# Patient Record
Sex: Male | Born: 1995 | Race: White | Hispanic: No | Marital: Married | Smoking: Never smoker
Health system: Southern US, Community
[De-identification: ages and names within clinical notes are randomized; demographics above are authoritative.]

---

## 2020-01-19 ENCOUNTER — Ambulatory Visit (HOSPITAL_COMMUNITY)
Admission: EM | Admit: 2020-01-19 | Discharge: 2020-01-19 | Disposition: A | Discharge status: Home / Self Care | Payer: Worker's Compensation | Attending: Family Medicine | Admitting: Family Medicine

## 2020-01-19 ENCOUNTER — Ambulatory Visit (INDEPENDENT_AMBULATORY_CARE_PROVIDER_SITE_OTHER): Payer: Worker's Compensation

## 2020-01-19 ENCOUNTER — Encounter (HOSPITAL_COMMUNITY): Payer: Self-pay

## 2020-01-19 ENCOUNTER — Other Ambulatory Visit: Payer: Self-pay

## 2020-01-19 DIAGNOSIS — S43002A Unspecified subluxation of left shoulder joint, initial encounter: Secondary | ICD-10-CM

## 2020-01-19 DIAGNOSIS — M25512 Pain in left shoulder: Secondary | ICD-10-CM | POA: Diagnosis not present

## 2020-01-19 NOTE — ED Triage Notes (Signed)
Pt is here with a left shoulder injury that happened after picking up a chest of drawers & heard his shoulder pop today at work, pt has not taken any meds to relieve discomfort.

## 2020-01-19 NOTE — Discharge Instructions (Addendum)
Please try ice  Please use the sling for one week.  Please follow up in one week.

## 2020-01-19 NOTE — ED Provider Notes (Signed)
[facility]    CSN: [id number] Arrival date & time: 01/19/20  1550      History   Chief Complaint Chief Complaint  Patient presents with  . Shoulder Injury    HPI Derek Arroyo is a 24 y.o. male.   He is presenting with an injury that he sustained at work.  He is having left shoulder pain after he felt a pop while lifting furniture.  Denies any history of similar pain.  No history of surgery.  No bruising or swelling.  Has trouble with external rotation and crossarm testing  HPI  History reviewed. No pertinent past medical history.  There are no problems to display for this patient.   History reviewed. No pertinent surgical history.     Home Medications    Prior to Admission medications   Not on File    Family History Family History  Problem Relation Age of Onset  . Breast cancer Mother   . Stomach cancer Father     Social History Social History   Tobacco Use  . Smoking status: Never Smoker  . Smokeless tobacco: Never Used  Vaping Use  . Vaping Use: Every day  Substance Use Topics  . Alcohol use: Yes  . Drug use: Not on file     Allergies   Patient has no known allergies.   Review of Systems Review of Systems  See HPI  Physical Exam Triage Vital Signs ED Triage Vitals  Enc Vitals Group     BP 01/19/20 1748 136/67     Pulse Rate 01/19/20 1748 87     Resp 01/19/20 1748 19     Temp 01/19/20 1748 98.7 F (37.1 C)     Temp Source 01/19/20 1748 Oral     SpO2 01/19/20 1748 99 %     Weight --      Height --      Head Circumference --      Peak Flow --      Pain Score 01/19/20 1745 7     Pain Loc --      Pain Edu? --      Excl. in GC? --    No data found.  Updated Vital Signs BP 136/67 (BP Location: Right Arm)   Pulse 87   Temp 98.7 F (37.1 C) (Oral)   Resp 19   SpO2 99%   Visual Acuity Right Eye Distance:   Left Eye Distance:   Bilateral Distance:    Right Eye Near:   Left Eye Near:    Bilateral Near:      Physical Exam Gen: NAD, alert, cooperative with exam, well-appearing ENT: normal lips, normal nasal mucosa,  Eye: normal EOM, normal conjunctiva and lids Neuro: normal tone, normal sensation to touch Psych:  normal insight, alert and oriented MSK:  Left shoulder: Normal external rotation. Normal speeds test. Pain with crossarm testing. Neurovascularly intact   UC Treatments / Results  Labs (all labs ordered are listed, but only abnormal results are displayed) Labs Reviewed - No data to display  EKG   Radiology DG Shoulder Left  Result Date: 01/19/2020 CLINICAL DATA:  Left shoulder pain EXAM: LEFT SHOULDER - 2+ VIEW COMPARISON:  None. FINDINGS: There is no evidence of fracture or dislocation. There is no evidence of arthropathy or other focal bone abnormality. Soft tissues are unremarkable. IMPRESSION: Negative. Electronically Signed   By: Jasmine Pang M.D.   On: 01/19/2020 19:13    Procedures Procedures (including critical care time)  Medications Ordered in UC Medications - No data to display  Initial Impression / Assessment and Plan / UC Course  I have reviewed the triage vital signs and the nursing notes.  Pertinent labs & imaging results that were available during my care of the patient were reviewed by me and considered in my medical decision making (see chart for details).     Mr. Derek Arroyo is a 24 year old male that is presenting with a left shoulder pain after an injury sustained at work.  Symptoms suggestive shoulder subluxation.  Imaging was negative for fracture.  Provided sling and counseled on supportive care.  Given indications on follow-up.  Provided work note.  Final Clinical Impressions(s) / UC Diagnoses   Final diagnoses:  Shoulder subluxation, left, initial encounter     Discharge Instructions     Please try ice  Please use the sling for one week.  Please follow up in one week.      ED Prescriptions    None     PDMP not reviewed this  encounter.   Myra Rude, MD 01/19/20 [phone]

## 2020-01-27 ENCOUNTER — Ambulatory Visit (INDEPENDENT_AMBULATORY_CARE_PROVIDER_SITE_OTHER): Payer: Worker's Compensation | Admitting: Family Medicine

## 2020-01-27 ENCOUNTER — Ambulatory Visit: Payer: Self-pay

## 2020-01-27 ENCOUNTER — Other Ambulatory Visit: Payer: Self-pay

## 2020-01-27 ENCOUNTER — Encounter: Payer: Self-pay | Admitting: Family Medicine

## 2020-01-27 VITALS — BP 120/77 | HR 80 | Ht 70.0 in | Wt 235.0 lb

## 2020-01-27 DIAGNOSIS — S43002D Unspecified subluxation of left shoulder joint, subsequent encounter: Secondary | ICD-10-CM | POA: Insufficient documentation

## 2020-01-27 DIAGNOSIS — S43002A Unspecified subluxation of left shoulder joint, initial encounter: Secondary | ICD-10-CM

## 2020-01-27 DIAGNOSIS — M25512 Pain in left shoulder: Secondary | ICD-10-CM

## 2020-01-27 DIAGNOSIS — S43432D Superior glenoid labrum lesion of left shoulder, subsequent encounter: Secondary | ICD-10-CM | POA: Insufficient documentation

## 2020-01-27 MED ORDER — PREDNISONE 5 MG PO TABS: ORAL_TABLET | ORAL | 0 refills | Status: AC

## 2020-01-27 NOTE — Assessment & Plan Note (Signed)
Pain is the result of an injury sustained at work.  Occurred on 8/31.  Still likely a subluxation.  Ultrasound suggestive of capsular thickening and axillary view. -Counseled on supportive care. -Prednisone. -Provided work note. -Could consider physical therapy or further imaging.

## 2020-01-27 NOTE — Progress Notes (Signed)
Derek Arroyo - 24 y.o. male MRN [id number]  Date of birth: 05-10-1996  SUBJECTIVE:  Including CC & ROS.  Chief Complaint  Patient presents with  . Shoulder Injury    left x 01/19/2020    Derek Arroyo is a 24 y.o. male that is presenting with left shoulder pain.  His pain is a result of an injury sustained at work.  He was lifting an object and pulling towards himself and felt a pop.  He was placed in a sling on 8/31 but his pain seems to be ongoing.  He has pain with riding in a car or walking around.  He reports intermittent abnormal sensations in his hand.  No improvement with medications thus far.  Independent review of the left shoulder x-ray from 8/31 shows no acute abnormality.   Review of Systems See HPI   HISTORY: Past Medical, Surgical, Social, and Family History Reviewed & Updated per EMR.   Pertinent Historical Findings include:  No past medical history on file.  No past surgical history on file.  Family History  Problem Relation Age of Onset  . Breast cancer Mother   . Stomach cancer Father     Social History   Socioeconomic History  . Marital status: Married    Spouse name: Not on file  . Number of children: Not on file  . Years of education: Not on file  . Highest education level: Not on file  Occupational History  . Not on file  Tobacco Use  . Smoking status: Never Smoker  . Smokeless tobacco: Never Used  Vaping Use  . Vaping Use: Every day  Substance and Sexual Activity  . Alcohol use: Yes  . Drug use: Not on file  . Sexual activity: Yes    Birth control/protection: None    Comment: Married  Other Topics Concern  . Not on file  Social History Narrative  . Not on file   Social Determinants of Health   Financial Resource Strain:   . Difficulty of Paying Living Expenses: Not on file  Food Insecurity:   . Worried About Programme researcher, broadcasting/film/video in the Last Year: Not on file  . Ran Out of Food in the Last Year: Not on file  Transportation  Needs:   . Lack of Transportation (Medical): Not on file  . Lack of Transportation (Non-Medical): Not on file  Physical Activity:   . Days of Exercise per Week: Not on file  . Minutes of Exercise per Session: Not on file  Stress:   . Feeling of Stress : Not on file  Social Connections:   . Frequency of Communication with Friends and Family: Not on file  . Frequency of Social Gatherings with Friends and Family: Not on file  . Attends Religious Services: Not on file  . Active Member of Clubs or Organizations: Not on file  . Attends Banker Meetings: Not on file  . Marital Status: Not on file  Intimate Partner Violence:   . Fear of Current or Ex-Partner: Not on file  . Emotionally Abused: Not on file  . Physically Abused: Not on file  . Sexually Abused: Not on file     PHYSICAL EXAM:  VS: BP 120/77   Pulse 80   Ht 5\' 10"  (1.778 m)   Wt 235 lb (106.6 kg)   BMI 33.72 kg/m  Physical Exam Gen: NAD, alert, cooperative with exam, well-appearing MSK:  Left shoulder: Limited flexion and abduction. Normal passive flexion and abduction.  No ecchymosis or swelling. Normal grip strength. Normal elbow range of motion. Neurovascularly intact  Limited ultrasound: Left shoulder:  Normal-appearing biceps tendon and short and long axis. Normal subscapularis. Normal-appearing supraspinatus and dynamic and static testing. Normal-appearing posterior glenohumeral joint. Axillary view of the capsule appears to be thickened  Summary: Findings would suggest a possible capsular irritation.  Ultrasound and interpretation by Clare Gandy, MD    ASSESSMENT & PLAN:   Shoulder subluxation, left, initial encounter Pain is the result of an injury sustained at work.  Occurred on 8/31.  Still likely a subluxation.  Ultrasound suggestive of capsular thickening and axillary view. -Counseled on supportive care. -Prednisone. -Provided work note. -Could consider physical therapy or  further imaging.

## 2020-01-27 NOTE — Patient Instructions (Signed)
Good to see you Please use ice as needed  Please try the range of motion movements  Please let me know how you feel after the prednisone  Please send me a message in MyChart with any questions or updates.  Please see me back in 4 weeks.   --Dr. Jordan Likes

## 2020-02-04 ENCOUNTER — Telehealth: Payer: Self-pay | Admitting: Family Medicine

## 2020-02-04 NOTE — Telephone Encounter (Signed)
Pt's WC adjuster/ Ainsley Spinner @ [facility] [phone] called to request LOV notes --forwarding to fax# [phone] for review .  -- WC clm# in claim info tab/folder.  --glh

## 2020-02-18 ENCOUNTER — Telehealth: Payer: Self-pay | Admitting: Family Medicine

## 2020-02-18 NOTE — Telephone Encounter (Signed)
Answer patient's questions.   Myra Rude, MD [facility] 02/18/2020, 12:42 PM

## 2020-02-25 ENCOUNTER — Telehealth: Payer: Self-pay | Admitting: Family Medicine

## 2020-02-25 NOTE — Telephone Encounter (Signed)
Patient called upset states he received call from [facility] adjuster stating he can no longer be seen by Dr.Schmitz but has to go to different [facility] provider , he has been trying to contact Adjuster without success & wishes Dr. Jordan Likes to call him back to discuss.   --Forwarding message to Provider.  glh

## 2020-02-26 ENCOUNTER — Other Ambulatory Visit: Payer: Self-pay

## 2020-02-26 ENCOUNTER — Ambulatory Visit (INDEPENDENT_AMBULATORY_CARE_PROVIDER_SITE_OTHER): Payer: Worker's Compensation | Admitting: Family Medicine

## 2020-02-26 ENCOUNTER — Encounter: Payer: Self-pay | Admitting: Family Medicine

## 2020-02-26 DIAGNOSIS — S43002D Unspecified subluxation of left shoulder joint, subsequent encounter: Secondary | ICD-10-CM | POA: Diagnosis not present

## 2020-02-26 NOTE — Assessment & Plan Note (Signed)
Symptoms are ongoing since his injury at work.  Has had limited improvement with using the sling.  He does have an MRI ordered -Counseled on home exercise therapy and supportive care. -Discussed physical therapy. -Follow-up after MRI

## 2020-02-26 NOTE — Patient Instructions (Signed)
Good to see you Please try the exercises and range of motion movements  Please try heat   Please send me a message in MyChart with any questions or updates.  Please send me the MRI and we can schedule a virtual visit.   --Dr. Jordan Likes

## 2020-02-26 NOTE — Telephone Encounter (Signed)
Seen in clinic today.   Myra Rude, MD [facility] 02/26/2020, 8:58 AM

## 2020-02-26 NOTE — Progress Notes (Signed)
Shown Dissinger - 24 y.o. male MRN [id number]  Date of birth: 10/19/1995  SUBJECTIVE:  Including CC & ROS.  Chief Complaint  Patient presents with   Follow-up    left shoulder    Derek Arroyo is a 24 y.o. male that is following up for his left shoulder pain.  His symptoms are still ongoing since his injury at work.  He has tightness around the neck as well as altered sensation in the forearm.  He has trouble with driving as well as lifting any objects.  Symptoms are still localized around the shoulder.   Review of Systems See HPI   HISTORY: Past Medical, Surgical, Social, and Family History Reviewed & Updated per EMR.   Pertinent Historical Findings include:  No past medical history on file.  No past surgical history on file.  Family History  Problem Relation Age of Onset   Breast cancer Mother    Stomach cancer Father     Social History   Socioeconomic History   Marital status: Married    Spouse name: Not on file   Number of children: Not on file   Years of education: Not on file   Highest education level: Not on file  Occupational History   Not on file  Tobacco Use   Smoking status: Never Smoker   Smokeless tobacco: Never Used  Vaping Use   Vaping Use: Every day  Substance and Sexual Activity   Alcohol use: Yes   Drug use: Not on file   Sexual activity: Yes    Birth control/protection: None    Comment: Married  Other Topics Concern   Not on file  Social History Narrative   Not on file   Social Determinants of Health   Financial Resource Strain:    Difficulty of Paying Living Expenses: Not on file  Food Insecurity:    Worried About Programme researcher, broadcasting/film/video in the Last Year: Not on file   [employer] of Food in the Last Year: Not on file  Transportation Needs:    Lack of Transportation (Medical): Not on file   Lack of Transportation (Non-Medical): Not on file  Physical Activity:    Days of Exercise per Week: Not on file   Minutes  of Exercise per Session: Not on file  Stress:    Feeling of Stress : Not on file  Social Connections:    Frequency of Communication with Friends and Family: Not on file   Frequency of Social Gatherings with Friends and Family: Not on file   Attends Religious Services: Not on file   Active Member of Clubs or Organizations: Not on file   Attends Banker Meetings: Not on file   Marital Status: Not on file  Intimate Partner Violence:    Fear of Current or Ex-Partner: Not on file   Emotionally Abused: Not on file   Physically Abused: Not on file   Sexually Abused: Not on file     PHYSICAL EXAM:  VS: BP (!) 146/79    Pulse 90    Ht 5\' 10"  (1.778 m)    Wt 235 lb (106.6 kg)    BMI 33.72 kg/m  Physical Exam Gen: NAD, alert, cooperative with exam, well-appearing    ASSESSMENT & PLAN:   Shoulder subluxation, left, subsequent encounter Symptoms are ongoing since his injury at work.  Has had limited improvement with using the sling.  He does have an MRI ordered -Counseled on home exercise therapy and supportive care. -Discussed  physical therapy. -Follow-up after MRI

## 2020-03-29 ENCOUNTER — Ambulatory Visit (INDEPENDENT_AMBULATORY_CARE_PROVIDER_SITE_OTHER): Payer: Self-pay | Admitting: Family Medicine

## 2020-03-29 ENCOUNTER — Other Ambulatory Visit: Payer: Self-pay

## 2020-03-29 ENCOUNTER — Encounter: Payer: Self-pay | Admitting: Family Medicine

## 2020-03-29 DIAGNOSIS — S43432D Superior glenoid labrum lesion of left shoulder, subsequent encounter: Secondary | ICD-10-CM

## 2020-03-29 NOTE — Assessment & Plan Note (Signed)
Still experiencing pain of his left shoulder from an injury at work.  The MRI was demonstrating a possible posterior labral tear.  He is still unable to carry even a cup without pain.  Having altered sensation of the arm as well. -Counseled on home exercise therapy and supportive care. - discuss with a surgeon about his findings.

## 2020-03-29 NOTE — Patient Instructions (Signed)
Good to see you Please continue the exercises   Please send me a message in MyChart with any questions or updates.  Please see us back as needed.   --Dr. Fallen Crisostomo  

## 2020-03-29 NOTE — Progress Notes (Signed)
Derek Arroyo - 24 y.o. male MRN [id number]  Date of birth: 09-18-1995  SUBJECTIVE:  Including CC & ROS.  Chief Complaint  Patient presents with  . Follow-up    left shoulder    Derek Arroyo is a 24 y.o. male that is presenting with ongoing left shoulder pain.  He had an MRI of his left shoulder performed that showed a possible posterior labral tear versus a cleft.  He is still symptomatic and unable to lift even a cup without significant pain.   Review of Systems See HPI   HISTORY: Past Medical, Surgical, Social, and Family History Reviewed & Updated per EMR.   Pertinent Historical Findings include:  No past medical history on file.  No past surgical history on file.  Family History  Problem Relation Age of Onset  . Breast cancer Mother   . Stomach cancer Father     Social History   Socioeconomic History  . Marital status: Married    Spouse name: Not on file  . Number of children: Not on file  . Years of education: Not on file  . Highest education level: Not on file  Occupational History  . Not on file  Tobacco Use  . Smoking status: Never Smoker  . Smokeless tobacco: Never Used  Vaping Use  . Vaping Use: Every day  Substance and Sexual Activity  . Alcohol use: Yes  . Drug use: Not on file  . Sexual activity: Yes    Birth control/protection: None    Comment: Married  Other Topics Concern  . Not on file  Social History Narrative  . Not on file   Social Determinants of Health   Financial Resource Strain:   . Difficulty of Paying Living Expenses: Not on file  Food Insecurity:   . Worried About Programme researcher, broadcasting/film/video in the Last Year: Not on file  . Ran Out of Food in the Last Year: Not on file  Transportation Needs:   . Lack of Transportation (Medical): Not on file  . Lack of Transportation (Non-Medical): Not on file  Physical Activity:   . Days of Exercise per Week: Not on file  . Minutes of Exercise per Session: Not on file  Stress:   .  Feeling of Stress : Not on file  Social Connections:   . Frequency of Communication with Friends and Family: Not on file  . Frequency of Social Gatherings with Friends and Family: Not on file  . Attends Religious Services: Not on file  . Active Member of Clubs or Organizations: Not on file  . Attends Banker Meetings: Not on file  . Marital Status: Not on file  Intimate Partner Violence:   . Fear of Current or Ex-Partner: Not on file  . Emotionally Abused: Not on file  . Physically Abused: Not on file  . Sexually Abused: Not on file     PHYSICAL EXAM:  VS: BP 130/85   Pulse 93   Ht 5\' 10"  (1.778 m)   Wt 235 lb (106.6 kg)   BMI 33.72 kg/m  Physical Exam Gen: NAD, alert, cooperative with exam, well-appearing     ASSESSMENT & PLAN:   Labral tear of shoulder, left, subsequent encounter Still experiencing pain of his left shoulder from an injury at work.  The MRI was demonstrating a possible posterior labral tear.  He is still unable to carry even a cup without pain.  Having altered sensation of the arm as well. -Counseled on home exercise  therapy and supportive care. - discuss with a surgeon about his findings.

## 2020-03-31 ENCOUNTER — Encounter: Payer: Self-pay | Admitting: Family Medicine

## 2021-01-10 IMAGING — DX DG SHOULDER 2+V*L*
3 series · 3 of 3 positions shown · non-contrast
Comparison: None.

CLINICAL DATA: Left shoulder pain

EXAM:
LEFT SHOULDER - 2+ VIEW

[shoulder ap]
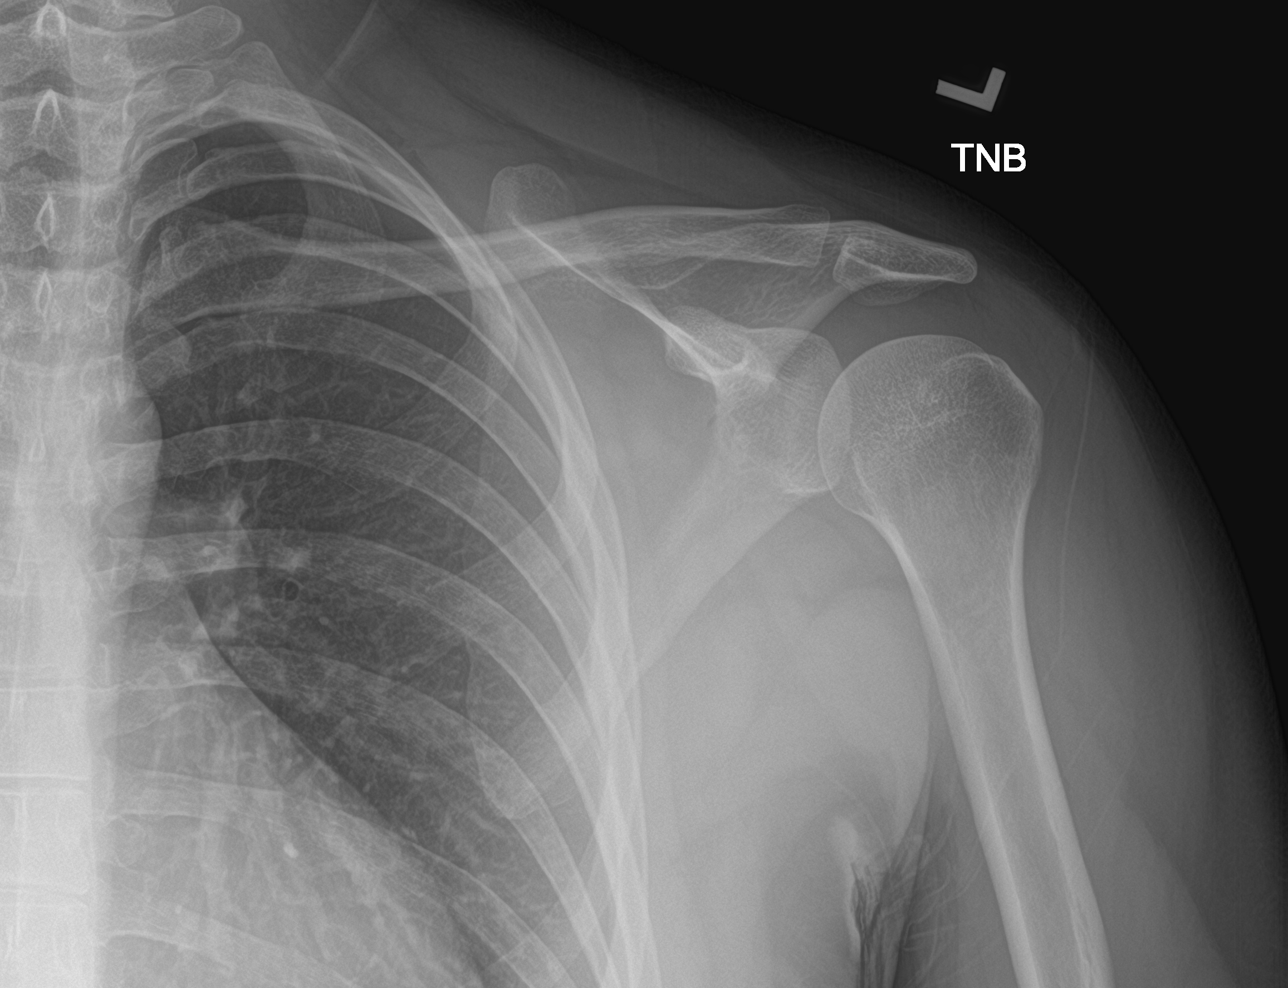

[shoulder grashey]
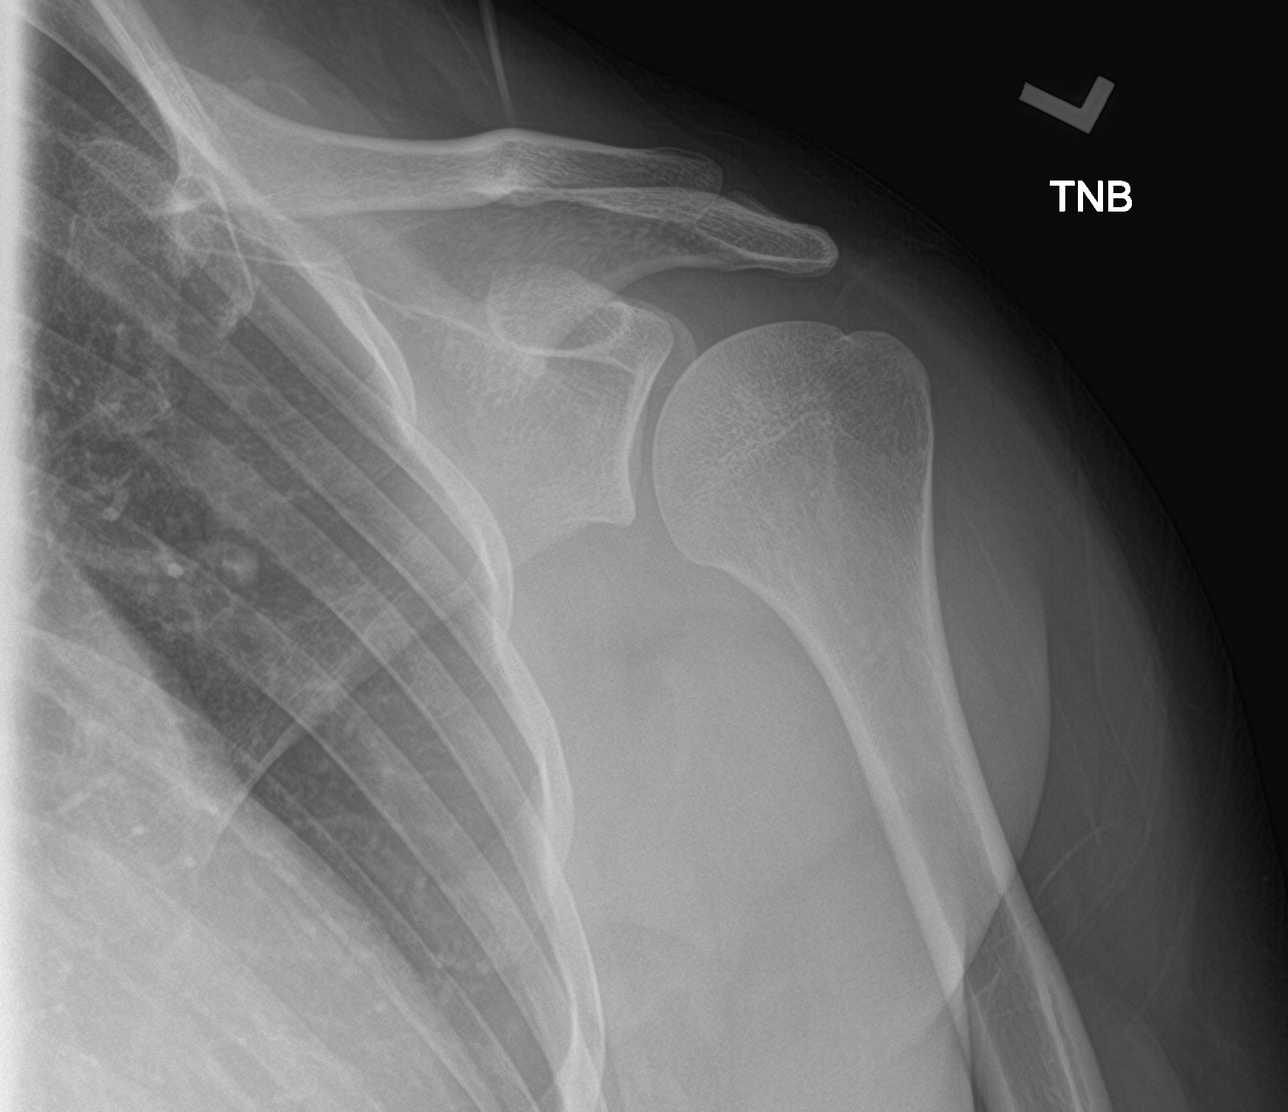

[shoulder y-view]
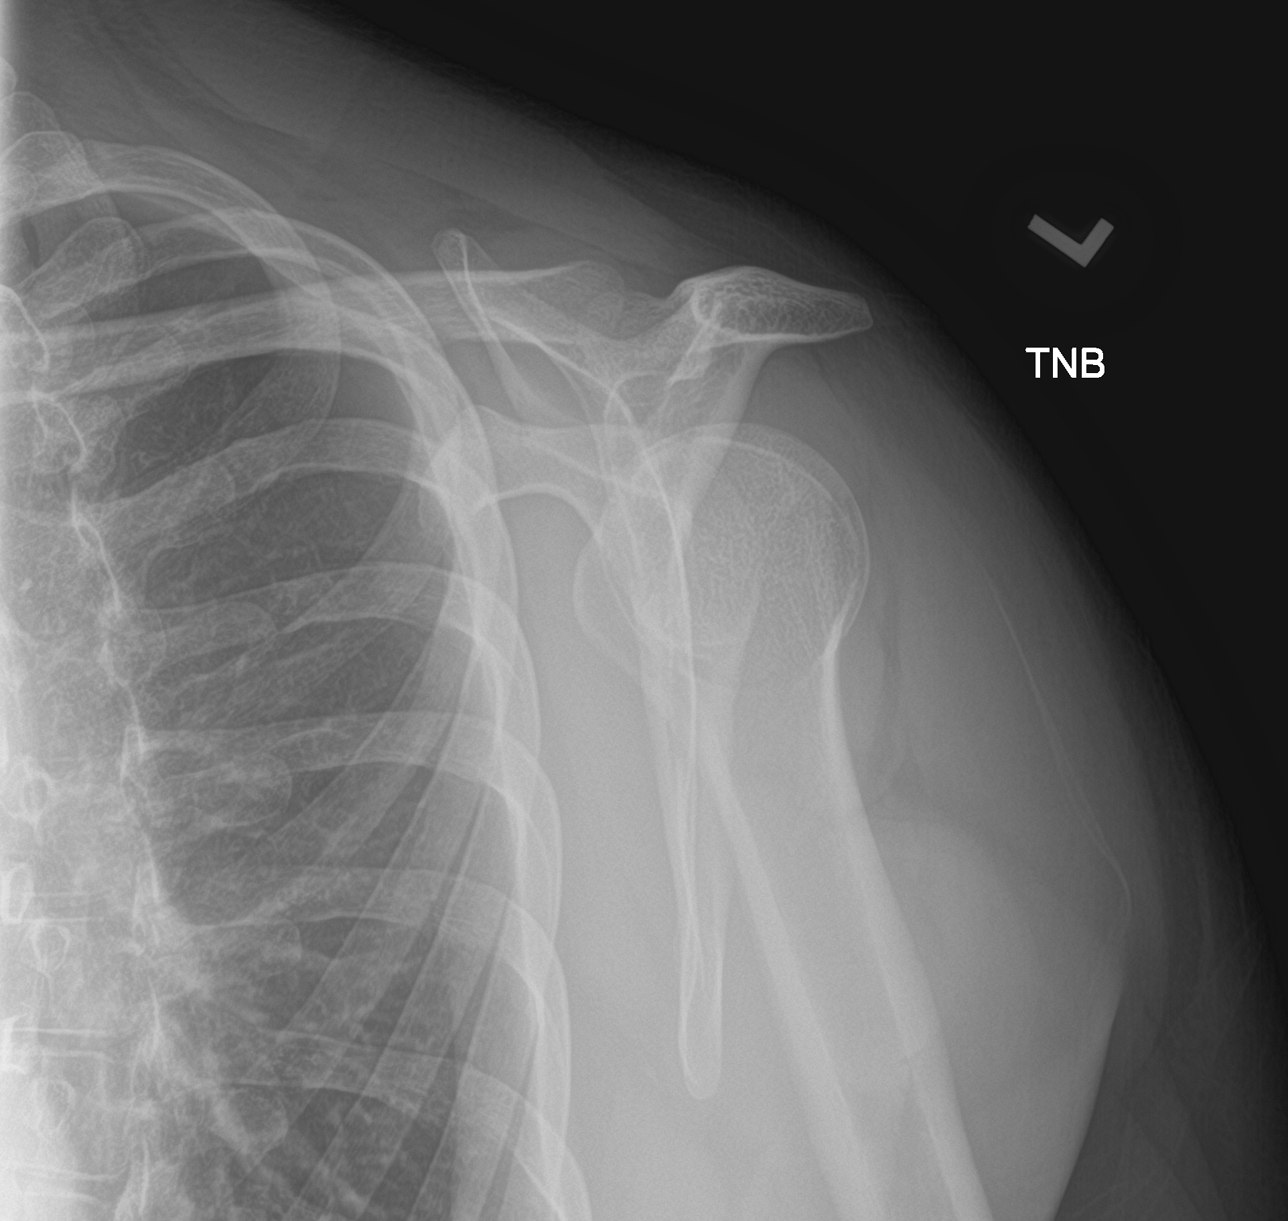

[3 of 3 positions shown; findings below may reference images not displayed]

FINDINGS: There is no evidence of fracture or dislocation. There is no
evidence of arthropathy or other focal bone abnormality. Soft
tissues are unremarkable.
IMPRESSION: Negative.

## 2022-09-03 ENCOUNTER — Encounter: Payer: Self-pay | Admitting: *Deleted
